# Patient Record
Sex: Male | Born: 1995 | Race: White | Hispanic: No | Marital: Single | Smoking: Never smoker
Health system: Southern US, Community
[De-identification: ages and names within clinical notes are randomized; demographics above are authoritative.]

## PROBLEM LIST (undated history)

## (undated) DIAGNOSIS — R51 Headache: Secondary | ICD-10-CM

## (undated) DIAGNOSIS — J45909 Unspecified asthma, uncomplicated: Secondary | ICD-10-CM

## (undated) DIAGNOSIS — R519 Headache, unspecified: Secondary | ICD-10-CM

## (undated) HISTORY — PX: ARM HARDWARE REMOVAL: SUR1122

## (undated) HISTORY — PX: WISDOM TOOTH EXTRACTION: SHX21

---
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
## 2016-03-21 ENCOUNTER — Other Ambulatory Visit: Payer: Self-pay | Admitting: Orthopedic Surgery

## 2016-03-21 DIAGNOSIS — M24411 Recurrent dislocation, right shoulder: Secondary | ICD-10-CM

## 2016-03-21 DIAGNOSIS — S43004D Unspecified dislocation of right shoulder joint, subsequent encounter: Secondary | ICD-10-CM

## 2016-03-21 DIAGNOSIS — M21821 Other specified acquired deformities of right upper arm: Secondary | ICD-10-CM

## 2016-03-31 ENCOUNTER — Ambulatory Visit
Admission: RE | Admit: 2016-03-31 | Discharge: 2016-03-31 | Disposition: A | Discharge status: Home / Self Care | Payer: 59 | Source: Ambulatory Visit | Attending: Orthopedic Surgery | Admitting: Orthopedic Surgery

## 2016-03-31 DIAGNOSIS — M21821 Other specified acquired deformities of right upper arm: Secondary | ICD-10-CM

## 2016-03-31 DIAGNOSIS — S43004D Unspecified dislocation of right shoulder joint, subsequent encounter: Secondary | ICD-10-CM

## 2016-03-31 DIAGNOSIS — M25311 Other instability, right shoulder: Secondary | ICD-10-CM | POA: Diagnosis present

## 2016-03-31 DIAGNOSIS — M24411 Recurrent dislocation, right shoulder: Secondary | ICD-10-CM | POA: Diagnosis not present

## 2016-03-31 DIAGNOSIS — S43431D Superior glenoid labrum lesion of right shoulder, subsequent encounter: Secondary | ICD-10-CM | POA: Insufficient documentation

## 2016-03-31 DIAGNOSIS — X58XXXD Exposure to other specified factors, subsequent encounter: Secondary | ICD-10-CM | POA: Insufficient documentation

## 2016-04-05 ENCOUNTER — Other Ambulatory Visit: Payer: Self-pay | Admitting: Surgery

## 2016-04-05 DIAGNOSIS — S43431D Superior glenoid labrum lesion of right shoulder, subsequent encounter: Secondary | ICD-10-CM

## 2016-04-05 DIAGNOSIS — M25311 Other instability, right shoulder: Secondary | ICD-10-CM

## 2016-04-05 DIAGNOSIS — M24411 Recurrent dislocation, right shoulder: Secondary | ICD-10-CM

## 2016-04-15 ENCOUNTER — Ambulatory Visit
Admission: RE | Admit: 2016-04-15 | Discharge: 2016-04-15 | Disposition: A | Discharge status: Home / Self Care | Payer: 59 | Source: Ambulatory Visit | Attending: Surgery | Admitting: Surgery

## 2016-04-15 ENCOUNTER — Encounter
Admission: RE | Admit: 2016-04-15 | Discharge: 2016-04-15 | Disposition: A | Discharge status: Home / Self Care | Payer: 59 | Source: Ambulatory Visit | Attending: Surgery | Admitting: Surgery

## 2016-04-15 DIAGNOSIS — S43431D Superior glenoid labrum lesion of right shoulder, subsequent encounter: Secondary | ICD-10-CM | POA: Insufficient documentation

## 2016-04-15 DIAGNOSIS — X58XXXD Exposure to other specified factors, subsequent encounter: Secondary | ICD-10-CM | POA: Insufficient documentation

## 2016-04-15 DIAGNOSIS — M25311 Other instability, right shoulder: Secondary | ICD-10-CM

## 2016-04-15 DIAGNOSIS — M24411 Recurrent dislocation, right shoulder: Secondary | ICD-10-CM | POA: Diagnosis not present

## 2016-04-15 DIAGNOSIS — S43491D Other sprain of right shoulder joint, subsequent encounter: Secondary | ICD-10-CM | POA: Insufficient documentation

## 2016-04-15 MED ORDER — LIDOCAINE HCL (PF) 1 % IJ SOLN
10.0000 mL | Freq: Once | INTRAMUSCULAR | Status: AC
  Administered 2016-04-15: 10 mL
  Filled 2016-04-15: qty 10

## 2016-04-15 MED ORDER — IOPAMIDOL (ISOVUE-300) INJECTION 61%
5.0000 mL | Freq: Once | INTRAVENOUS | Status: AC | PRN
  Administered 2016-04-15: 5 mL

## 2016-04-15 MED ORDER — GADOBENATE DIMEGLUMINE 529 MG/ML IV SOLN
0.1000 mL | Freq: Once | INTRAVENOUS | Status: AC | PRN
  Administered 2016-04-15: 0.1 mL via INTRA_ARTICULAR

## 2016-05-24 ENCOUNTER — Encounter
Admission: RE | Admit: 2016-05-24 | Discharge: 2016-05-24 | Disposition: A | Discharge status: Home / Self Care | Payer: 59 | Source: Ambulatory Visit | Attending: Surgery | Admitting: Surgery

## 2016-05-24 HISTORY — DX: Headache, unspecified: R51.9

## 2016-05-24 HISTORY — DX: Unspecified asthma, uncomplicated: J45.909

## 2016-05-24 HISTORY — DX: Headache: R51

## 2016-05-24 NOTE — Patient Instructions (Signed)
  Your procedure is scheduled on: 05-31-16 Report to Same Day Surgery 2nd floor medical mall To find out your arrival time please call [phone] between 1PM - 3PM on 05-30-16  Remember: Instructions that are not followed completely may result in serious medical risk, up to and including death, or upon the discretion of your surgeon and anesthesiologist your surgery may need to be rescheduled.    _x___ 1. Do not eat food or drink liquids after midnight. No gum chewing or hard candies.     __x__ 2. No Alcohol for 24 hours before or after surgery.   __x__3. No Smoking for 24 prior to surgery.   ____  4. Bring all medications with you on the day of surgery if instructed.    __x__ 5. Notify your doctor if there is any change in your medical condition     (cold, fever, infections).     Do not wear jewelry, make-up, hairpins, clips or nail polish.  Do not wear lotions, powders, or perfumes. You may wear deodorant.  Do not shave 48 hours prior to surgery. Men may shave face and neck.  Do not bring valuables to the hospital.    [facility] is not responsible for any belongings or valuables.               Contacts, dentures or bridgework may not be worn into surgery.  Leave your suitcase in the car. After surgery it may be brought to your room.  For patients admitted to the hospital, discharge time is determined by your treatment team.   Patients discharged the day of surgery will not be allowed to drive home.    Please read over the following fact sheets that you were given:   [facility] and or MRSA Information   ____ Take these medicines the morning of surgery with A SIP OF WATER:    1. NONE  2.  3.  4.  5.  6.  ____Fleets enema or Magnesium Citrate as directed.   ____ Use CHG Soap or sage wipes as directed on instruction sheet   ____ Use inhalers on the day of surgery and bring to hospital day of surgery  ____ Stop metformin 2 days prior to  surgery    ____ Take 1/2 of usual insulin dose the night before surgery and none on the morning of surgery.   ____ Stop aspirin or coumadin, or plavix  x__ Stop Anti-inflammatories such as Advil, Aleve, Ibuprofen, Motrin, Naproxen,          Naprosyn, Goodies powders or aspirin products-STOP IBUPROFEN AND NAPROXEN NOW-Ok to take Tylenol.   ____ Stop supplements until after surgery.    ____ Bring C-Pap to the hospital.

## 2016-05-30 MED ORDER — CEFAZOLIN SODIUM-DEXTROSE 2-4 GM/100ML-% IV SOLN
2.0000 g | Freq: Once | INTRAVENOUS | Status: AC
  Administered 2016-05-31: 2 g via INTRAVENOUS

## 2016-05-31 ENCOUNTER — Encounter: Payer: Self-pay | Admitting: *Deleted

## 2016-05-31 ENCOUNTER — Ambulatory Visit
Admission: RE | Admit: 2016-05-31 | Discharge: 2016-05-31 | Disposition: A | Discharge status: Home / Self Care | Payer: 59 | Source: Ambulatory Visit | Attending: Surgery | Admitting: Surgery

## 2016-05-31 ENCOUNTER — Encounter
Admission: RE | Disposition: A | Discharge status: Home / Self Care | Payer: Self-pay | Source: Ambulatory Visit | Attending: Surgery

## 2016-05-31 ENCOUNTER — Ambulatory Visit: Payer: 59 | Admitting: Anesthesiology

## 2016-05-31 DIAGNOSIS — M24411 Recurrent dislocation, right shoulder: Secondary | ICD-10-CM | POA: Insufficient documentation

## 2016-05-31 HISTORY — PX: SHOULDER ARTHROSCOPY WITH BANKART REPAIR: SHX5673

## 2016-05-31 SURGERY — SHOULDER ARTHROSCOPY WITH BANKART REPAIR
Anesthesia: Regional | Laterality: Right

## 2016-05-31 MED ORDER — EPINEPHRINE PF 1 MG/ML IJ SOLN
INTRAMUSCULAR | Status: DC | PRN
  Administered 2016-05-31: 2 mL

## 2016-05-31 MED ORDER — MIDAZOLAM HCL 5 MG/5ML IJ SOLN
2.0000 mg | Freq: Once | INTRAMUSCULAR | Status: AC
  Administered 2016-05-31: 2 mg via INTRAVENOUS

## 2016-05-31 MED ORDER — PROMETHAZINE HCL 25 MG/ML IJ SOLN: 6.2500 mg | INTRAMUSCULAR | Status: DC | PRN

## 2016-05-31 MED ORDER — DEXAMETHASONE SODIUM PHOSPHATE 10 MG/ML IJ SOLN
INTRAMUSCULAR | Status: DC | PRN
  Administered 2016-05-31: 10 mg via INTRAVENOUS

## 2016-05-31 MED ORDER — MIDAZOLAM HCL 2 MG/2ML IJ SOLN
INTRAMUSCULAR | Status: DC | PRN
  Administered 2016-05-31: 2 mg via INTRAVENOUS

## 2016-05-31 MED ORDER — FAMOTIDINE 20 MG PO TABS
ORAL_TABLET | ORAL | Status: AC
  Filled 2016-05-31: qty 1

## 2016-05-31 MED ORDER — LIDOCAINE HCL (PF) 1 % IJ SOLN
INTRAMUSCULAR | Status: AC
  Filled 2016-05-31: qty 5

## 2016-05-31 MED ORDER — PHENYLEPHRINE HCL 10 MG/ML IJ SOLN
INTRAMUSCULAR | Status: DC | PRN
  Administered 2016-05-31 (×4): 100 ug via INTRAVENOUS

## 2016-05-31 MED ORDER — ROPIVACAINE HCL 5 MG/ML IJ SOLN
INTRAMUSCULAR | Status: AC
  Filled 2016-05-31: qty 40

## 2016-05-31 MED ORDER — SUGAMMADEX SODIUM 500 MG/5ML IV SOLN
INTRAVENOUS | Status: DC | PRN
  Administered 2016-05-31: 500 mg via INTRAVENOUS

## 2016-05-31 MED ORDER — FENTANYL CITRATE (PF) 100 MCG/2ML IJ SOLN: 25.0000 ug | INTRAMUSCULAR | Status: DC | PRN

## 2016-05-31 MED ORDER — LACTATED RINGERS IV SOLN
INTRAVENOUS | Status: DC
  Administered 2016-05-31: 13:00:00 via INTRAVENOUS

## 2016-05-31 MED ORDER — BUPIVACAINE-EPINEPHRINE (PF) 0.5% -1:200000 IJ SOLN
INTRAMUSCULAR | Status: AC
Start: 2016-05-31 — End: 2016-05-31
  Filled 2016-05-31: qty 30

## 2016-05-31 MED ORDER — ACETAMINOPHEN 10 MG/ML IV SOLN
INTRAVENOUS | Status: AC
  Filled 2016-05-31: qty 100

## 2016-05-31 MED ORDER — EPINEPHRINE PF 1 MG/ML IJ SOLN
INTRAMUSCULAR | Status: AC
  Filled 2016-05-31: qty 2

## 2016-05-31 MED ORDER — CEFAZOLIN SODIUM-DEXTROSE 2-4 GM/100ML-% IV SOLN
INTRAVENOUS | Status: AC
  Filled 2016-05-31: qty 100

## 2016-05-31 MED ORDER — PROPOFOL 10 MG/ML IV BOLUS
INTRAVENOUS | Status: DC | PRN
  Administered 2016-05-31: 200 mg via INTRAVENOUS
  Administered 2016-05-31: 100 mg via INTRAVENOUS

## 2016-05-31 MED ORDER — FAMOTIDINE 20 MG PO TABS
20.0000 mg | ORAL_TABLET | Freq: Once | ORAL | Status: AC
  Administered 2016-05-31: 20 mg via ORAL

## 2016-05-31 MED ORDER — MIDAZOLAM HCL 5 MG/5ML IJ SOLN
INTRAMUSCULAR | Status: AC
  Administered 2016-05-31: 2 mg via INTRAVENOUS
  Filled 2016-05-31: qty 5

## 2016-05-31 MED ORDER — LIDOCAINE HCL (PF) 1 % IJ SOLN
INTRAMUSCULAR | Status: DC | PRN
  Administered 2016-05-31: 1 mL via INTRADERMAL

## 2016-05-31 MED ORDER — BUPIVACAINE-EPINEPHRINE 0.5% -1:200000 IJ SOLN
INTRAMUSCULAR | Status: DC | PRN
  Administered 2016-05-31: 30 mL

## 2016-05-31 MED ORDER — ROPIVACAINE HCL 5 MG/ML IJ SOLN
INTRAMUSCULAR | Status: DC | PRN
  Administered 2016-05-31: 5 mL via PERINEURAL

## 2016-05-31 MED ORDER — ACETAMINOPHEN 10 MG/ML IV SOLN
INTRAVENOUS | Status: DC | PRN
  Administered 2016-05-31: 1000 mg via INTRAVENOUS

## 2016-05-31 MED ORDER — ROCURONIUM BROMIDE 100 MG/10ML IV SOLN
INTRAVENOUS | Status: DC | PRN
  Administered 2016-05-31: 40 mg via INTRAVENOUS
  Administered 2016-05-31: 20 mg via INTRAVENOUS
  Administered 2016-05-31: 40 mg via INTRAVENOUS

## 2016-05-31 MED ORDER — ONDANSETRON HCL 4 MG/2ML IJ SOLN
INTRAMUSCULAR | Status: DC | PRN
  Administered 2016-05-31: 4 mg via INTRAVENOUS

## 2016-05-31 MED ORDER — LIDOCAINE HCL (CARDIAC) 20 MG/ML IV SOLN
INTRAVENOUS | Status: DC | PRN
  Administered 2016-05-31: 100 mg via INTRAVENOUS

## 2016-05-31 MED ORDER — OXYCODONE HCL 5 MG PO TABS: 5.0000 mg | ORAL_TABLET | ORAL | 0 refills | Status: AC | PRN

## 2016-05-31 MED ORDER — FENTANYL CITRATE (PF) 100 MCG/2ML IJ SOLN
INTRAMUSCULAR | Status: DC | PRN
  Administered 2016-05-31 (×2): 50 ug via INTRAVENOUS

## 2016-05-31 SURGICAL SUPPLY — 47 items
ANCHOR ALL-SUT Q-FIX 1.8 BLUE (Anchor) ×6 IMPLANT
BIT DRILL JUGRKNT W/NDL BIT2.9 (DRILL) IMPLANT
BLADE FULL RADIUS 3.5 (BLADE) IMPLANT
BLADE SHAVER 4.5X7 STR FR (MISCELLANEOUS) IMPLANT
BUR ACROMIONIZER 4.0 (BURR) IMPLANT
BUR BR 5.5 WIDE MOUTH (BURR) ×2 IMPLANT
CANNULA SHAVER 8MMX76MM (CANNULA) ×4 IMPLANT
CHLORAPREP W/TINT 26ML (MISCELLANEOUS) ×4 IMPLANT
COVER MAYO STAND STRL (DRAPES) ×2 IMPLANT
DECANTER SPIKE VIAL GLASS SM (MISCELLANEOUS) ×2 IMPLANT
DRAPE IMP U-DRAPE 54X76 (DRAPES) ×4 IMPLANT
DRILL JUGGERKNOT W/NDL BIT 2.9 (DRILL)
DRSG OPSITE POSTOP 4X8 (GAUZE/BANDAGES/DRESSINGS) ×2 IMPLANT
ELECT REM PT RETURN 9FT ADLT (ELECTROSURGICAL) ×2
ELECTRODE REM PT RTRN 9FT ADLT (ELECTROSURGICAL) ×1 IMPLANT
GAUZE PETRO XEROFOAM 1X8 (MISCELLANEOUS) ×2 IMPLANT
GAUZE SPONGE 4X4 12PLY STRL (GAUZE/BANDAGES/DRESSINGS) ×2 IMPLANT
GLOVE BIO SURGEON STRL SZ7.5 (GLOVE) ×4 IMPLANT
GLOVE BIO SURGEON STRL SZ8 (GLOVE) ×4 IMPLANT
GLOVE BIOGEL PI IND STRL 8 (GLOVE) ×1 IMPLANT
GLOVE BIOGEL PI INDICATOR 8 (GLOVE) ×1
GLOVE INDICATOR 8.0 STRL GRN (GLOVE) ×2 IMPLANT
GOWN STRL REUS W/ TWL LRG LVL3 (GOWN DISPOSABLE) ×2 IMPLANT
GOWN STRL REUS W/ TWL XL LVL3 (GOWN DISPOSABLE) ×1 IMPLANT
GOWN STRL REUS W/TWL LRG LVL3 (GOWN DISPOSABLE) ×2
GOWN STRL REUS W/TWL XL LVL3 (GOWN DISPOSABLE) ×1
GRASPER SUT 15 45D LOW PRO (SUTURE) IMPLANT
IV LACTATED RINGER IRRG 3000ML (IV SOLUTION) ×2
IV LR IRRIG 3000ML ARTHROMATIC (IV SOLUTION) ×2 IMPLANT
KIT STABILIZATION SHOULDER (MISCELLANEOUS) ×2 IMPLANT
KIT SUTURE 1.8 Q-FIX DISP (KITS) ×2 IMPLANT
MANIFOLD NEPTUNE II (INSTRUMENTS) ×2 IMPLANT
MASK FACE SPIDER DISP (MASK) ×2 IMPLANT
MAT BLUE FLOOR 46X72 FLO (MISCELLANEOUS) ×2 IMPLANT
NEEDLE REVERSE CUT 1/2 CRC (NEEDLE) IMPLANT
PACK ARTHROSCOPY SHOULDER (MISCELLANEOUS) ×2 IMPLANT
SLING ARM LRG DEEP (SOFTGOODS) IMPLANT
SLING ULTRA II LG (MISCELLANEOUS) ×2 IMPLANT
STAPLER SKIN PROX 35W (STAPLE) ×2 IMPLANT
STRAP SAFETY BODY (MISCELLANEOUS) ×2 IMPLANT
SUT ETHIBOND 0 MO6 C/R (SUTURE) IMPLANT
SUT VIC AB 2-0 CT1 27 (SUTURE) ×2
SUT VIC AB 2-0 CT1 TAPERPNT 27 (SUTURE) ×2 IMPLANT
TAPE MICROFOAM 4IN (TAPE) ×2 IMPLANT
TUBING ARTHRO INFLOW-ONLY STRL (TUBING) ×2 IMPLANT
TUBING CONNECTING 10 (TUBING) ×2 IMPLANT
WAND HAND CNTRL MULTIVAC 90 (MISCELLANEOUS) ×2 IMPLANT

## 2016-05-31 NOTE — Anesthesia Procedure Notes (Signed)
Anesthesia Regional Block:  Interscalene brachial plexus block  Pre-Anesthetic Checklist: ,, timeout performed, Correct Patient, Correct Site, Correct Laterality, Correct Procedure, Correct Position, site marked, Risks and benefits discussed,  Surgical consent,  Pre-op evaluation,  At surgeon's request and post-op pain management  Laterality: Upper and Right  Prep: chloraprep       Needles:  Injection technique: Single-shot  Needle Type: Stimiplex     Needle Length: 5cm 5 cm Needle Gauge: 22 and 22 G    Additional Needles:  Procedures: ultrasound guided (picture in chart) Interscalene brachial plexus block Narrative:  Start time: 05/31/2016 1:28 PM End time: 05/31/2016 1:30 PM Injection made incrementally with aspirations every 5 mL.  Performed by: Personally  Anesthesiologist: Margorie JohnPISCITELLO, JOSEPH K  Additional Notes: Functioning IV was confirmed and monitors were applied.  A 50mm 22ga Stimuplex needle was used. Sterile prep,hand hygiene and sterile gloves were used.  Negative aspiration and negative test dose prior to administration of local anesthetic. The patient expierienced a vagal reaction, with HR falling into the 30s, as first 5 cc of local were given so the procedure was aborted with plan to offer post operative block prn.

## 2016-05-31 NOTE — Progress Notes (Signed)
Pt to pacu for block.

## 2016-05-31 NOTE — Anesthesia Procedure Notes (Signed)
Procedure Name: Intubation Date/Time: 05/31/2016 2:08 PM Performed by: Stormy FabianURTIS, Maretta Overdorf Pre-anesthesia Checklist: Patient identified, Patient being monitored, Timeout performed, Emergency Drugs available and Suction available Patient Re-evaluated:Patient Re-evaluated prior to inductionOxygen Delivery Method: Circle system utilized Preoxygenation: Pre-oxygenation with 100% oxygen Intubation Type: IV induction Ventilation: Mask ventilation without difficulty Laryngoscope Size: Mac and 3 Grade View: Grade II Tube type: Oral Tube size: 7.0 mm Number of attempts: 1 Airway Equipment and Method: Stylet Placement Confirmation: ETT inserted through vocal cords under direct vision,  positive ETCO2 and breath sounds checked- equal and bilateral Secured at: 21 cm Tube secured with: Tape Dental Injury: Teeth and Oropharynx as per pre-operative assessment  Comments: Anterior airway

## 2016-05-31 NOTE — Progress Notes (Signed)
Ice pack to shoulder 

## 2016-05-31 NOTE — Discharge Instructions (Addendum)
Keep dressing dry and intact.  °May shower after dressing changed on post-op day #4 (Saturday).  °Cover staples with Band-Aids after drying off. °Apply ice frequently to shoulder. °Take ibuprofen 800 mg TID with meals for 7-10 days, then as necessary. °Take oxycodone as prescribed when needed.  °May supplement with ES Tylenol if necessary. °Keep shoulder immobilizer on at all times except may remove for bathing purposes. °Follow-up in 10-14 days or as scheduled. °

## 2016-05-31 NOTE — Transfer of Care (Signed)
Immediate Anesthesia Transfer of Care Note  Patient: Tyrone Gregory  Procedure(s) Performed: Procedure(s): SHOULDER ARTHROSCOPY WITH BANKART REPAIR (Right)  Patient Location: PACU  Anesthesia Type:General  Level of Consciousness: sedated  Airway & Oxygen Therapy: Patient Spontanous Breathing and Patient connected to face mask oxygen  Post-op Assessment: Report given to RN and Post -op Vital signs reviewed and stable  Post vital signs: Reviewed and stable  Last Vitals:  Vitals:   05/31/16 1355 05/31/16 1600  BP: 107/67 (!) 126/54  Pulse: 68 91  Resp: 15 19  Temp:  36.1 C    Complications: No apparent anesthesia complications
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
## 2016-05-31 NOTE — Anesthesia Preprocedure Evaluation (Signed)
Anesthesia Evaluation  Patient identified by MRN, date of birth, ID band Patient awake    Reviewed: Allergy & Precautions, H&P , NPO status , Patient's Chart, lab work & pertinent test results, reviewed documented beta blocker date and time   History of Anesthesia Complications Negative for: history of anesthetic complications  Airway Mallampati: I  TM Distance: >3 FB Neck ROM: full    Dental no notable dental hx. (+) Teeth Intact   Pulmonary neg shortness of breath, asthma , neg sleep apnea, neg COPD, neg recent URI,           Cardiovascular Exercise Tolerance: Good negative cardio ROS       Neuro/Psych negative neurological ROS  negative psych ROS   GI/Hepatic negative GI ROS, Neg liver ROS,   Endo/Other  negative endocrine ROS  Renal/GU negative Renal ROS  negative genitourinary   Musculoskeletal   Abdominal   Peds  Hematology negative hematology ROS (+)   Anesthesia Other Findings Past Medical History: No date: Asthma     Comment: IN MIDDLE SCHOOL-NO INHALER No date: Headache     Comment: H/O MIGRAINES   Reproductive/Obstetrics negative OB ROS                             Anesthesia Physical Anesthesia Plan  ASA: II  Anesthesia Plan: General and Regional   Post-op Pain Management:    Induction:   Airway Management Planned:   Additional Equipment:   Intra-op Plan:   Post-operative Plan:   Informed Consent: I have reviewed the patients History and Physical, chart, labs and discussed the procedure including the risks, benefits and alternatives for the proposed anesthesia with the patient or authorized representative who has indicated his/her understanding and acceptance.   Dental Advisory Given  Plan Discussed with: Anesthesiologist, CRNA and Surgeon  Anesthesia Plan Comments:         Anesthesia Quick Evaluation

## 2016-05-31 NOTE — Anesthesia Postprocedure Evaluation (Signed)
Anesthesia Post Note  Patient: Tyrone Gregory  Procedure(s) Performed: Procedure(s) (LRB): SHOULDER ARTHROSCOPY WITH BANKART REPAIR (Right)  Patient location during evaluation: PACU Anesthesia Type: General Level of consciousness: awake and alert Pain management: pain level controlled Vital Signs Assessment: post-procedure vital signs reviewed and stable Respiratory status: spontaneous breathing, nonlabored ventilation, respiratory function stable and patient connected to nasal cannula oxygen Cardiovascular status: blood pressure returned to baseline and stable Postop Assessment: no signs of nausea or vomiting Anesthetic complications: no    Last Vitals:  Vitals:   05/31/16 1630 05/31/16 1640  BP: 125/61 119/65  Pulse: (!) 55 (!) 58  Resp: 15 16  Temp:  36.2 C    Last Pain:  Vitals:   05/31/16 1640  TempSrc:   PainSc: Asleep                 Cleda MccreedyJoseph K Mandela Bello

## 2016-05-31 NOTE — H&P (Signed)
Paper H&P to be scanned into permanent record. H&P reviewed. No changes. 

## 2016-05-31 NOTE — Progress Notes (Signed)
Circulation positive to right hand    Can wiggle fingers   Warm to touch

## 2016-06-01 ENCOUNTER — Encounter: Payer: Self-pay | Admitting: Surgery

## 2016-08-01 NOTE — Addendum Note (Signed)
Addendum  created 08/01/16 [id number] by Stormy FabianLinda Tariq Pernell, CRNA   Charge Capture section accepted

## 2017-04-10 ENCOUNTER — Emergency Department
Admission: EM | Admit: 2017-04-10 | Discharge: 2017-04-10 | Disposition: A | Discharge status: Home / Self Care | Payer: 59 | Attending: Emergency Medicine | Admitting: Emergency Medicine

## 2017-04-10 ENCOUNTER — Encounter: Payer: Self-pay | Admitting: Emergency Medicine

## 2017-04-10 DIAGNOSIS — M7581 Other shoulder lesions, right shoulder: Secondary | ICD-10-CM | POA: Insufficient documentation

## 2017-04-10 DIAGNOSIS — M778 Other enthesopathies, not elsewhere classified: Secondary | ICD-10-CM

## 2017-04-10 DIAGNOSIS — M25511 Pain in right shoulder: Secondary | ICD-10-CM

## 2017-04-10 DIAGNOSIS — J45909 Unspecified asthma, uncomplicated: Secondary | ICD-10-CM | POA: Diagnosis not present

## 2017-04-10 MED ORDER — PREDNISONE 20 MG PO TABS
60.0000 mg | ORAL_TABLET | Freq: Once | ORAL | Status: AC
  Administered 2017-04-10: 60 mg via ORAL
  Filled 2017-04-10: qty 3

## 2017-04-10 MED ORDER — METHYLPREDNISOLONE 4 MG PO TBPK: ORAL_TABLET | ORAL | 0 refills | Status: AC

## 2017-04-10 NOTE — ED Triage Notes (Signed)
Patient ambulatory to triage with steady gait, without difficulty or distress noted; Reports right shoulder pain; hx surgery last November; denies any specific injury

## 2017-04-10 NOTE — ED Notes (Signed)
Pt discharged to home.  Family member driving.  Discharge instructions reviewed.  Verbalized understanding.  No questions or concerns at this time.  Teach back verified.  Pt in NAD.  No items left in ED.   
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
## 2017-04-10 NOTE — Discharge Instructions (Signed)
Please start Medrol Dosepak. After finishing Medrol Dosepak start ibuprofen 600-800 mg every 6-8 hours with food as needed for pain. Follow-up with orthopedist. Return to the emergency department for any worsening symptoms or changes in her health.

## 2017-04-10 NOTE — ED Provider Notes (Signed)
[facility]-EMERGENCY DEPARTMENT Provider Note   CSN: [id number] Arrival date & time: 04/10/17  1854     History   Chief Complaint Chief Complaint  Patient presents with  . Shoulder Pain    HPI Tyrone Gregory is a 21 y.o. male presents to the emergent department for evaluation of right shoulder pain. Patient has had shoulder pain for 4 weeks. No trauma or injury. He is status post right shoulder arthroscopy for Bankart lesion repair, had been doing well back in July during his follow-up appointment. Patient had regained strength and range of motion. Patient works full-time at a [employer], performs a lot of heavy lifting pushing and pulling.  HPI  Past Medical History:  Diagnosis Date  . 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 Headache    H/O MIGRAINES    There are no active problems to display for this patient.   Past Surgical History:  Procedure Laterality Date  . ARM HARDWARE REMOVAL    . SHOULDER ARTHROSCOPY WITH BANKART REPAIR Right 05/31/2016   Procedure: SHOULDER ARTHROSCOPY WITH BANKART REPAIR;  Surgeon: Christena Flake, MD;  Location: [facility] ORS;  Service: Orthopedics;  Laterality: Right;  . WISDOM TOOTH EXTRACTION         Home Medications    Prior to Admission medications   Medication Sig Start Date End Date Taking? Authorizing Provider  cetirizine (ZYRTEC) 10 MG tablet Take 10 mg by mouth daily as needed for allergies.     [provider]  ibuprofen (ADVIL,MOTRIN) 200 MG tablet Take 400 mg by mouth every 6 (six) hours as needed for moderate pain.    [provider]  methylPREDNISolone (MEDROL) 4 MG TBPK tablet Take medications as directed 04/10/17   Evon Slack, PA-C  oxyCODONE (ROXICODONE) 5 MG immediate release tablet Take 1-2 tablets (5-10 mg total) by mouth every 4 (four) hours as needed for severe pain. 05/31/16   Poggi, Excell Seltzer, MD    Family History No family history on file.  Social History Social History  Substance Use Topics   . Smoking status: Never Smoker  . Smokeless tobacco: Never Used  . Alcohol use No     Allergies   Patient has no known allergies.   Review of Systems Review of Systems  Constitutional: Negative for fever.  Respiratory: Negative for cough and shortness of breath.   Cardiovascular: Negative for chest pain.  Gastrointestinal: Negative for abdominal pain.  Genitourinary: Negative for difficulty urinating, dysuria and urgency.  Musculoskeletal: Positive for arthralgias. Negative for back pain, myalgias and neck pain.  Skin: Negative for rash.  Neurological: Negative for dizziness and headaches.     Physical Exam Updated Vital Signs BP 130/60   Pulse 60   Temp 97.9 F (36.6 C) (Oral)   Resp 18   Ht  (1.88 m)   Wt 90.7 kg (200 lb)   SpO2 99%   BMI 25.68 kg/m   Physical Exam  Constitutional: He is oriented to person, place, and time. He appears well-developed and well-nourished.  HENT:  Head: Normocephalic and atraumatic.  Eyes: Conjunctivae are normal.  Neck: Normal range of motion.  Cardiovascular: Normal rate.   Pulmonary/Chest: Effort normal. No respiratory distress.  Musculoskeletal:  Right Upper Extremity: Examination of the right shoulder and arm showed no bony abnormality or edema.  Patient has full range of motion of the right shoulder with abduction, flexion, internal rotation. Slightly decreased right shoulder external rotation.  The patient has no pain  with shoulder motion.  The patient has a positive Hawkins test and a positive Impingement test.  The patient has a negative drop arm test. The patient has a negative yergasons and speeds test.  The patient is non-tender along the deltoid muscle.  There is no subacromial space tenderness with no AC joint tenderness.  The patient has no instability of the shoulder with anterior-posterior motion.  There is a negative sulcus sign.  The rotator cuff muscle strength is 5/5 with supraspinatus, 5/5 with internal  rotation, and 5/5 with external rotation.  There is no crepitus with range of motion activities.    Neurological: The patient has sensation that is intact to light touch and pinprick bilaterally.  The patient has normal grip strength.  The patient has full biceps, wrist extension, grip, and interosseous strength.  The patient has 2 + DTRs bilaterally.  Vascular: The patient has less than 2 second capillary refill.  The patient has normal ulnar and radial pulses.  The patient has normal warmth to touch.    Neurological: He is alert and oriented to person, place, and time.  Skin: Skin is warm. No rash noted.  Psychiatric: He has a normal mood and affect. His behavior is normal. Thought content normal.     ED Treatments / Results  Labs (all labs ordered are listed, but only abnormal results are displayed) Labs Reviewed - No data to display  EKG  EKG Interpretation None       Radiology No results found.  Procedures Procedures (including critical care time)  Medications Ordered in ED Medications  predniSONE (DELTASONE) tablet 60 mg (60 mg Oral Given 04/10/17 2218)     Initial Impression / Assessment and Plan / ED Course  I have reviewed the triage vital signs and the nursing notes.  Pertinent labs & imaging results that were available during my care of the patient were reviewed by me and considered in my medical decision making (see chart for details).     21 year old with right shoulder pain. No trauma or focal bony tenderness. + impingement signs on exam. Will treat with anti-inflammatory medication, have patient follow-up with orthopedist. Patient educated on signs and symptoms return to ED for.  Final Clinical Impressions(s) / ED Diagnoses   Final diagnoses:  Acute pain of right shoulder  Subacromial tendonitis, right    New Prescriptions Discharge Medication List as of 04/10/2017 10:15 PM    START taking these medications   Details  methylPREDNISolone (MEDROL)  4 MG TBPK tablet Take medications as directed, Print         Ronnette Juniper 04/10/17 2226    Phineas Semen, MD 04/10/17 2227

## 2017-12-04 ENCOUNTER — Other Ambulatory Visit: Payer: Self-pay | Admitting: Student

## 2017-12-04 DIAGNOSIS — M24411 Recurrent dislocation, right shoulder: Secondary | ICD-10-CM

## 2017-12-04 DIAGNOSIS — M25311 Other instability, right shoulder: Secondary | ICD-10-CM

## 2017-12-04 DIAGNOSIS — S43431D Superior glenoid labrum lesion of right shoulder, subsequent encounter: Secondary | ICD-10-CM

## 2017-12-20 ENCOUNTER — Ambulatory Visit
Admission: RE | Admit: 2017-12-20 | Discharge: 2017-12-20 | Disposition: A | Discharge status: Home / Self Care | Payer: 59 | Source: Ambulatory Visit | Attending: Student | Admitting: Student

## 2017-12-20 DIAGNOSIS — X58XXXD Exposure to other specified factors, subsequent encounter: Secondary | ICD-10-CM | POA: Diagnosis not present

## 2017-12-20 DIAGNOSIS — M25311 Other instability, right shoulder: Secondary | ICD-10-CM | POA: Insufficient documentation

## 2017-12-20 DIAGNOSIS — S43431D Superior glenoid labrum lesion of right shoulder, subsequent encounter: Secondary | ICD-10-CM | POA: Insufficient documentation

## 2017-12-20 DIAGNOSIS — M24411 Recurrent dislocation, right shoulder: Secondary | ICD-10-CM

## 2017-12-20 MED ORDER — IOPAMIDOL (ISOVUE-200) INJECTION 41%
15.0000 mL | Freq: Once | INTRAVENOUS | Status: AC | PRN
  Administered 2017-12-20: 10 mL
  Filled 2017-12-20: qty 50

## 2017-12-20 MED ORDER — LIDOCAINE HCL (PF) 1 % IJ SOLN
10.0000 mL | Freq: Once | INTRAMUSCULAR | Status: AC
  Administered 2017-12-20: 10 mL
  Filled 2017-12-20: qty 10

## 2017-12-20 MED ORDER — GADOBENATE DIMEGLUMINE 529 MG/ML IV SOLN
0.1000 mL | Freq: Once | INTRAVENOUS | Status: AC | PRN
  Administered 2017-12-20: 0.1 mL via INTRA_ARTICULAR

## 2018-08-15 ENCOUNTER — Other Ambulatory Visit
Admission: RE | Admit: 2018-08-15 | Discharge: 2018-08-15 | Disposition: A | Discharge status: Home / Self Care | Payer: 59 | Source: Ambulatory Visit | Attending: Internal Medicine | Admitting: Internal Medicine

## 2018-08-15 DIAGNOSIS — R0781 Pleurodynia: Secondary | ICD-10-CM | POA: Insufficient documentation

## 2018-08-15 LAB — FIBRIN DERIVATIVES D-DIMER (ARMC ONLY): FIBRIN DERIVATIVES D-DIMER (ARMC): 341.71 ng{FEU}/mL (ref 0.00–499.00)

## 2018-09-08 IMAGING — MR MR SHOULDER*R* W/CM
6 series · 40 of 40 positions shown · IV contrast (agent unspecified)
Comparison: 04/15/2016

CLINICAL DATA: Right shoulder pain.  Dislocation January 2016.

EXAM:
MR ARTHROGRAM OF THE RIGHT SHOULDER
TECHNIQUE: Multiplanar, multisequence MR imaging of the right shoulder was
performed following the administration of intra-articular contrast.
CONTRAST:  See Injection Documentation.

[Series 3: T1 fat-sat · axial · 4.0mm · 0.47mm/px · z∈[-19,+78]mm · 6 of 23 slices shown (1 of 4)]
[im 1/23]
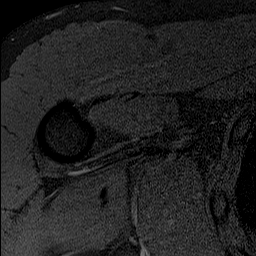
[im 5/23]
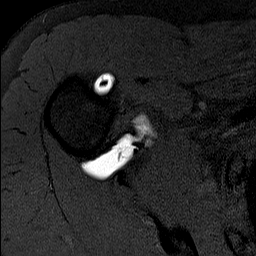
[im 9/23]
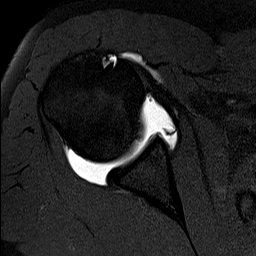
[im 14/23]
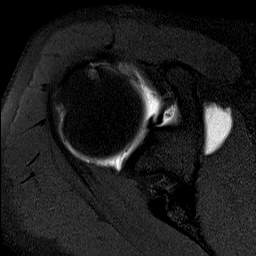
[im 18/23]
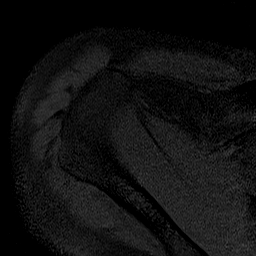
[im 23/23]
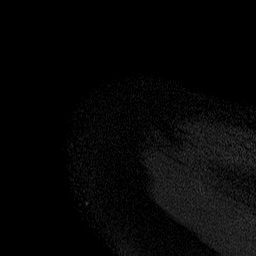

[Series 4: T1 fat-sat · oblique · 4.0mm · 0.62mm/px · 6 of 23 slices shown (2 of 4)]
[im 1/23]
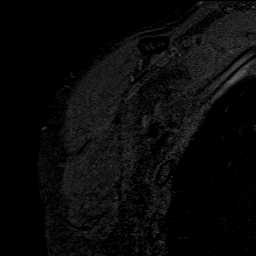
[im 5/23]
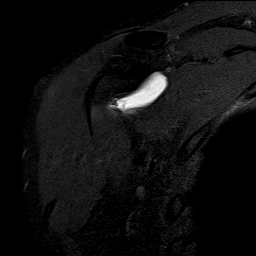
[im 9/23]
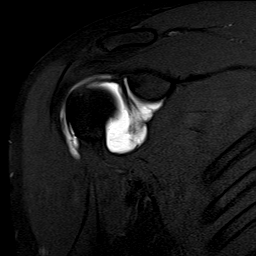
[im 14/23]
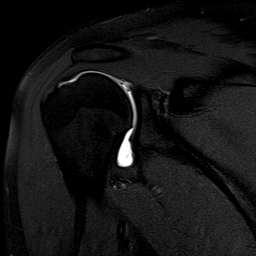
[im 18/23]
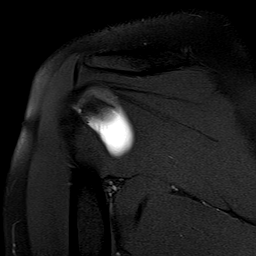
[im 23/23]
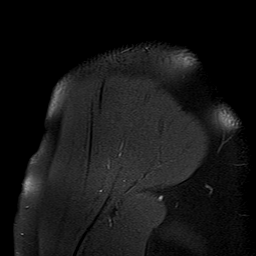

[Series 5: T2 fat-sat · oblique · 4.0mm · 0.62mm/px · 7 of 23 slices shown (1 of 2)]
[im 1/23]
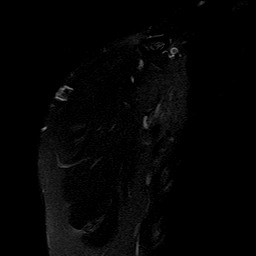
[im 4/23]
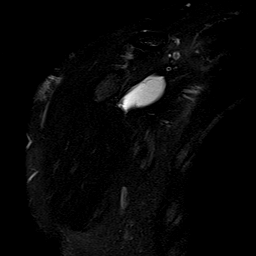
[im 8/23]
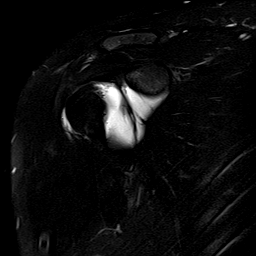
[im 12/23]
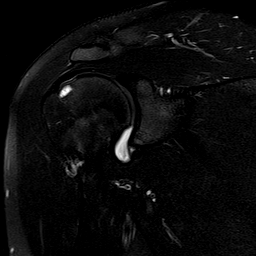
[im 15/23]
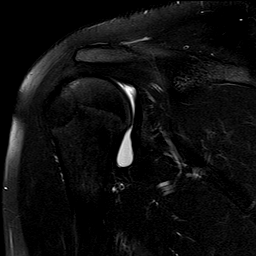
[im 19/23]
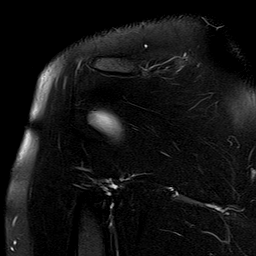
[im 23/23]
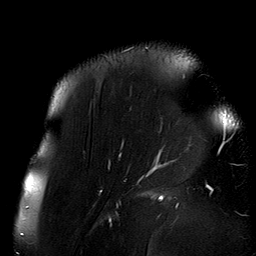

[Series 6: T1 fat-sat · oblique · non-contrast · 4.0mm · 0.42mm/px · 7 of 23 slices shown (3 of 4)]
[im 1/23]
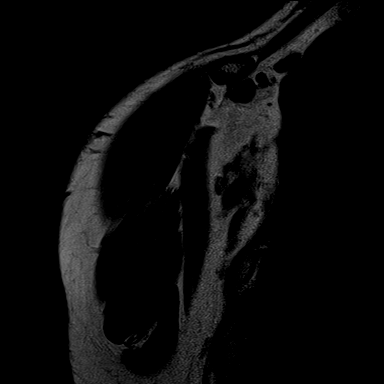
[im 4/23]
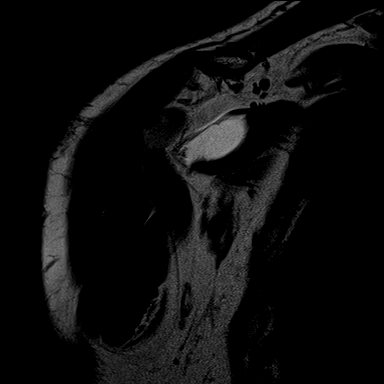
[im 8/23]
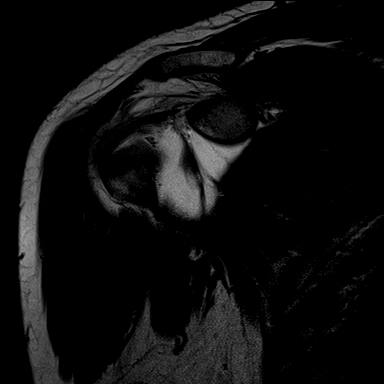
[im 12/23]
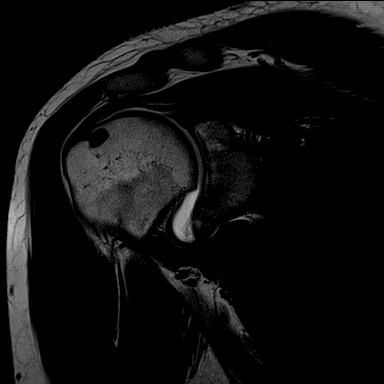
[im 15/23]
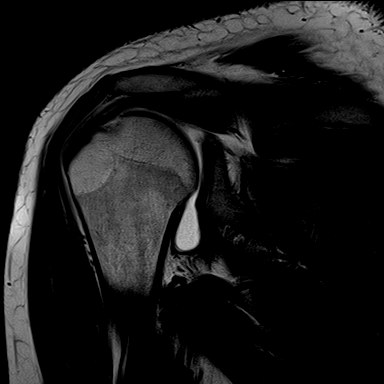
[im 19/23]
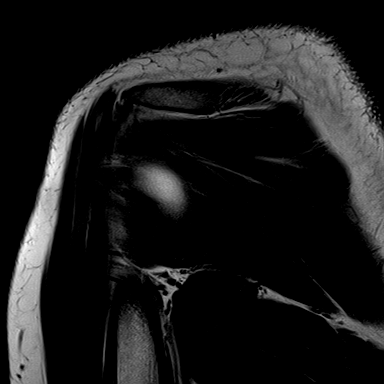
[im 23/23]
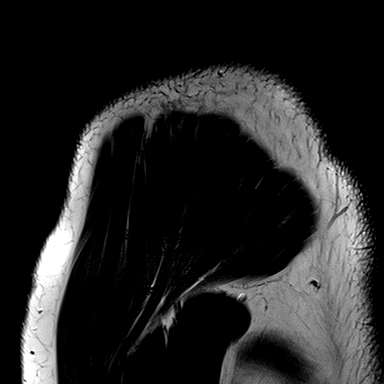

[Series 7: T2 fat-sat · oblique · 4.0mm · 0.62mm/px · 7 of 23 slices shown (2 of 2)]
[im 1/23]
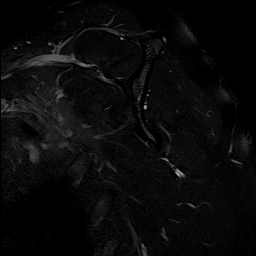
[im 4/23]
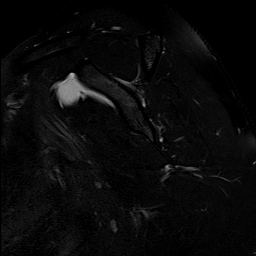
[im 8/23]
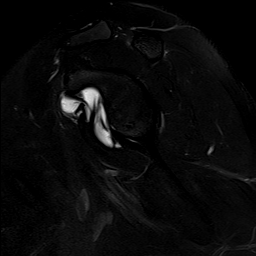
[im 12/23]
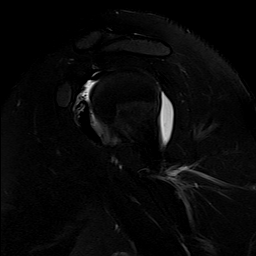
[im 15/23]
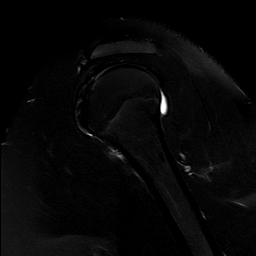
[im 19/23]
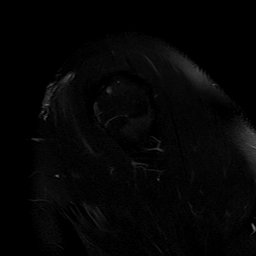
[im 23/23]
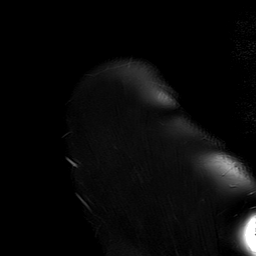

[Series 10: T1 fat-sat · sagittal · 4.0mm · 0.62mm/px · 7 of 24 slices shown (4 of 4)]
[im 1/24]
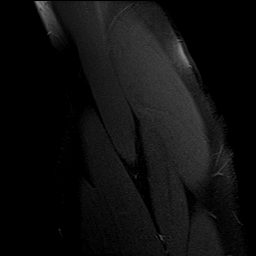
[im 4/24]
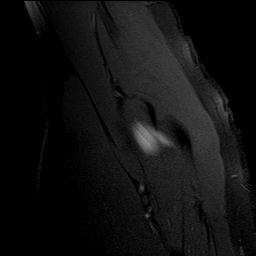
[im 8/24]
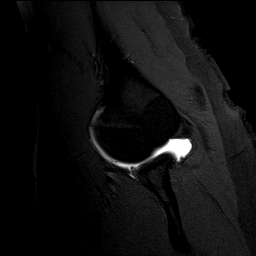
[im 12/24]
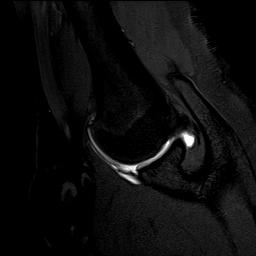
[im 16/24]
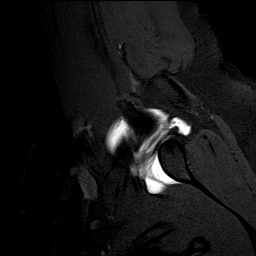
[im 20/24]
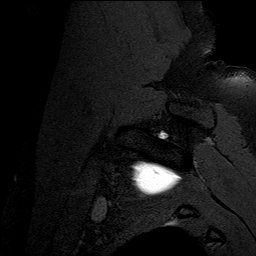
[im 24/24]
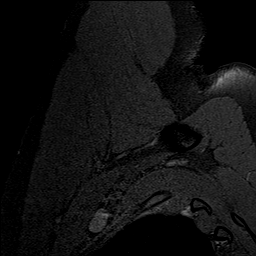

[40 of 40 positions shown; findings below may reference images not displayed]

FINDINGS: Rotator cuff: Supraspinatus tendon is intact. Infraspinatus tendon
is intact. Teres minor tendon is intact. Subscapularis tendon is
intact.

Muscles: No atrophy or fatty replacement of nor abnormal signal
within, the muscles of the rotator cuff.

Biceps long head: Intact.

Acromioclavicular Joint: Normal acromioclavicular joint. Type II
acromion. No subacromial/subdeltoid bursal fluid.

Glenohumeral Joint: Intraarticular contrast distending the joint
capsule. No chondral defect. Normal glenohumeral ligaments.

Labrum: Severe attenuation of the anterior inferior labrum which may
be secondary to chronic tear and debridement with possible recurrent
tear with a small flap of tissue seen along the anterior inferior
margin of the glenoid (image 18/series 3).

Bones: No acute osseous abnormality.
IMPRESSION: 1. Severe attenuation of the anterior inferior labrum which may be
secondary to chronic tear and debridement with possible recurrent
tear with a small flap of tissue seen along the anterior inferior
margin of the glenoid (image 18/series 3).

## 2019-11-07 ENCOUNTER — Emergency Department (HOSPITAL_COMMUNITY)
Admission: EM | Admit: 2019-11-07 | Discharge: 2019-11-07 | Disposition: A | Discharge status: Home / Self Care | Payer: No Typology Code available for payment source | Attending: Emergency Medicine | Admitting: Emergency Medicine

## 2019-11-07 ENCOUNTER — Other Ambulatory Visit: Payer: Self-pay

## 2019-11-07 ENCOUNTER — Encounter (HOSPITAL_COMMUNITY): Payer: Self-pay | Admitting: Emergency Medicine

## 2019-11-07 DIAGNOSIS — Y9389 Activity, other specified: Secondary | ICD-10-CM | POA: Diagnosis not present

## 2019-11-07 DIAGNOSIS — R519 Headache, unspecified: Secondary | ICD-10-CM | POA: Diagnosis not present

## 2019-11-07 DIAGNOSIS — S060X0A Concussion without loss of consciousness, initial encounter: Secondary | ICD-10-CM | POA: Diagnosis not present

## 2019-11-07 DIAGNOSIS — W2209XA Striking against other stationary object, initial encounter: Secondary | ICD-10-CM | POA: Diagnosis not present

## 2019-11-07 DIAGNOSIS — R42 Dizziness and giddiness: Secondary | ICD-10-CM | POA: Diagnosis present

## 2019-11-07 DIAGNOSIS — Y9289 Other specified places as the place of occurrence of the external cause: Secondary | ICD-10-CM | POA: Insufficient documentation

## 2019-11-07 DIAGNOSIS — Y990 Civilian activity done for income or pay: Secondary | ICD-10-CM | POA: Diagnosis not present

## 2019-11-07 DIAGNOSIS — H538 Other visual disturbances: Secondary | ICD-10-CM | POA: Insufficient documentation

## 2019-11-07 MED ORDER — ONDANSETRON HCL 4 MG PO TABS: 4.0000 mg | ORAL_TABLET | Freq: Four times a day (QID) | ORAL | 0 refills | Status: AC

## 2019-11-07 MED ORDER — ACETAMINOPHEN 325 MG PO TABS
650.0000 mg | ORAL_TABLET | Freq: Once | ORAL | Status: DC
  Filled 2019-11-07: qty 2

## 2019-11-07 MED ORDER — ONDANSETRON HCL 4 MG PO TABS
4.0000 mg | ORAL_TABLET | Freq: Once | ORAL | Status: AC
  Administered 2019-11-07: 18:00:00 4 mg via ORAL
  Filled 2019-11-07: qty 1

## 2019-11-07 NOTE — ED Triage Notes (Signed)
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  Pt denies LOC.  Pt c/o headache and feels nauseous

## 2019-11-07 NOTE — Discharge Instructions (Addendum)
Please use nausea medication as needed for nausea.  Continue to take over-the-counter Tylenol or ibuprofen.  Please read the pamphlet on concussions.  I have provided a sports medicine clinic that specializes in concussions if your symptoms do not improve.  Return to the ER if your symptoms worsen.

## 2019-11-07 NOTE — ED Provider Notes (Addendum)
[facility] EMERGENCY DEPARTMENT Provider Note   CSN: [id number] Arrival date & time: 11/07/19  1636     History Chief Complaint  Patient presents with  . Head Injury    Tyrone Gregory is a 24 y.o. male.  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  Patient states he hit the left side of his head, shortly after the impact he felt dizzy, nauseous, saw floaters for several seconds.  He did not lose consciousness.  Endorses some mild nausea currently but no vomiting, dizziness, neck pain, numbness or tingling, weakness.  He states that he feels like the vision out of his left eye feels a little funny, but he also thinks that this may be due to his glasses. He is  not on blood thinners.     Past Medical History:  Diagnosis Date  . 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 Headache    H/O MIGRAINES    There are no problems to display for this patient.   Past Surgical History:  Procedure Laterality Date  . ARM HARDWARE REMOVAL    . SHOULDER ARTHROSCOPY WITH BANKART REPAIR Right 05/31/2016   Procedure: SHOULDER ARTHROSCOPY WITH BANKART REPAIR;  Surgeon: Christena Flake, MD;  Location: [facility] ORS;  Service: Orthopedics;  Laterality: Right;  . WISDOM TOOTH EXTRACTION         No family history on file.  Social History   Tobacco Use  . Smoking status: Never Smoker  . Smokeless tobacco: Never Used  Substance Use Topics  . Alcohol use: No  . Drug use: No    Home Medications Prior to Admission medications   Medication Sig Start Date End Date Taking? Authorizing Provider  cetirizine (ZYRTEC) 10 MG tablet Take 10 mg by mouth daily as needed for allergies.     [provider]  ibuprofen (ADVIL,MOTRIN) 200 MG tablet Take 400 mg by mouth every 6 (six) hours as needed for moderate pain.    [provider]  methylPREDNISolone (MEDROL) 4 MG TBPK tablet Take medications  as directed 04/10/17   Evon Slack, PA-C  ondansetron (ZOFRAN) 4 MG tablet Take 1 tablet (4 mg total) by mouth every 6 (six) hours. 11/07/19   Mare Ferrari, PA-C  oxyCODONE (ROXICODONE) 5 MG immediate release tablet Take 1-2 tablets (5-10 mg total) by mouth every 4 (four) hours as needed for severe pain. 05/31/16   Poggi, Excell Seltzer, MD    Allergies    Patient has no known allergies.  Review of Systems   Review of Systems  Constitutional: Negative for chills and fever.  HENT: Negative for congestion, ear pain, facial swelling, sinus pressure and sinus pain.   Eyes: Positive for visual disturbance. Negative for photophobia, pain and redness.  Respiratory: Negative for shortness of breath.   Cardiovascular: Negative for chest pain.  Gastrointestinal: Negative for abdominal pain and nausea.  Musculoskeletal: Negative for neck pain and neck stiffness.  Skin: Negative for color change and wound.  Neurological: Positive for dizziness, numbness and headaches. Negative for syncope and weakness.    Physical Exam Updated Vital Signs BP 140/88 (BP Location: Left Arm)   Pulse 78   Temp 98 F (36.7 C) (Oral)   Resp 16   Ht 6\' 3"  (1.905 m)   Wt 89.4 kg   SpO2 99%   BMI 24.62 kg/m   Physical Exam Vitals  and nursing note reviewed.  Constitutional:      Appearance: Normal appearance. He is well-developed and normal weight. He is not ill-appearing, toxic-appearing or diaphoretic.  HENT:     Head: Normocephalic and atraumatic.     Nose: Nose normal.     Mouth/Throat:     Mouth: Mucous membranes are moist.     Pharynx: Oropharynx is clear.  Eyes:     Extraocular Movements: Extraocular movements intact.     Conjunctiva/sclera: Conjunctivae normal.     Pupils: Pupils are equal, round, and reactive to light.     Comments: Mild horizontal nystagmus  Cardiovascular:     Rate and Rhythm: Normal rate and regular rhythm.     Pulses: Normal pulses.     Heart sounds: Normal heart sounds. No  murmur.  Pulmonary:     Effort: Pulmonary effort is normal. No respiratory distress.     Breath sounds: Normal breath sounds.  Abdominal:     General: Abdomen is flat.     Palpations: Abdomen is soft.     Tenderness: There is no abdominal tenderness.  Musculoskeletal:        General: Tenderness present. No swelling. Normal range of motion.     Cervical back: Normal range of motion and neck supple. No rigidity or tenderness.     Comments: Point tenderness to left lateral scalp. No evidence of bruising, lesion, bleeding, injury, step-off, crepitus  Skin:    General: Skin is warm and dry.     Findings: No bruising, erythema, lesion or rash.  Neurological:     General: No focal deficit present.     Mental Status: He is alert and oriented to person, place, and time.     Cranial Nerves: No cranial nerve deficit.     Sensory: No sensory deficit.     Motor: No weakness.     Coordination: Coordination normal.     Gait: Gait normal.     Deep Tendon Reflexes: Reflexes normal.     Comments: Mental Status:  Alert, thought content appropriate, able to give a coherent history. Speech fluent without evidence of aphasia. Able to follow 2 step commands without difficulty.  Cranial Nerves:  II: Peripheral visual fields grossly normal, pupils equal, round, reactive to light III,IV, VI: ptosis not present, extra-ocular motions intact bilaterally  V,VII: smile symmetric, facial light touch sensation equal VIII: hearing grossly normal to voice  X: uvula elevates symmetrically  XI: bilateral shoulder shrug symmetric and strong XII: midline tongue extension without fassiculations Motor:  Normal tone. 5/5 strength of BUE and BLE major muscle groups including strong and equal grip strength and dorsiflexion/plantar flexion Sensory: light touch normal in all extremities. Cerebellar: normal finger-to-nose with bilateral upper extremities, Romberg sign absent Gait: normal gait and balance. Able to walk on  toes and heels with ease.   Psychiatric:        Mood and Affect: Mood normal.        Behavior: Behavior normal.     ED Results / Procedures / Treatments   Labs (all labs ordered are listed, but only abnormal results are displayed) Labs Reviewed - No data to display  EKG None  Radiology No results found.  Procedures Procedures (including critical care time)  Medications Ordered in ED Medications  acetaminophen (TYLENOL) tablet 650 mg (650 mg Oral Not Given 11/07/19 1806)  ondansetron (ZOFRAN) tablet 4 mg (4 mg Oral Given 11/07/19 1806)    ED Course  I have reviewed the triage vital  signs and the nursing notes.  Pertinent labs & imaging results that were available during my care of the patient were reviewed by me and considered in my medical decision making (see chart for details).    MDM Rules/Calculators/A&P                     Patient with head injury which did not cause of loss of consciousness but with persistent headache since the initial trauma.  No evidence of skull fracture on physical exam. Patient is not taking anticoagulants, is less than 65 and has no history of subarachnoid or subdural hemorrhage. Patient denies nausea, vomiting, amnesia, vision changes,cognitive or memory dysfunction and vertigo.  Patient with no focal neurological deficits on physical exam.  Discussed thoroughly symptoms to return to the emergency department including severe headaches, disequilibrium, vomiting, double vision, extremity weakness, difficulty ambulating, or any other concerning symptoms.  Discussed the likely etiology of patient's symptoms being concussive in nature.  Discussed the risk versus benefit of CT scan at this time I do not believe he warrants one. Patient agrees that CT is not indicated at this time.  Patient will be discharged with information pertaining to diagnosis and advised to use over-the-counter medications like NSAIDs and Tylenol for pain relief. Pt has also advised to  not participate in contact sports until they are completely asymptomatic for at least 1 week or they are cleared by their doctor. Visual acuity equal at 20/16 in both eyes, chronically decreased as pt wears glasses. Provided Zofran for symptomatic relief. Also provided information for [place] [facility] conc[facility] in case his symptoms do not improve. At this stage in the ED course  the patient has been adequatly medically screened and is stable for discharge.   Final Clinical Impression(s) / ED Diagnoses Final diagnoses:  Concussion without loss of consciousness, initial encounter    Rx / DC Orders ED Discharge Orders         Ordered    ondansetron (ZOFRAN) 4 MG tablet  Every 6 hours     11/07/19 1754                   Garald Balding, PA-C 11/07/19 [place], [place], DO 11/07/19 2009
